# Patient Record
Sex: Female | Born: 1980 | Race: White | Hispanic: No | Marital: Married | Smoking: Never smoker
Health system: Southern US, Community
[De-identification: ages and names within clinical notes are randomized; demographics above are authoritative.]

## PROBLEM LIST (undated history)

## (undated) DIAGNOSIS — Z5189 Encounter for other specified aftercare: Secondary | ICD-10-CM

## (undated) DIAGNOSIS — IMO0001 Reserved for inherently not codable concepts without codable children: Secondary | ICD-10-CM

---

## 2001-11-24 ENCOUNTER — Other Ambulatory Visit: Admission: RE | Admit: 2001-11-24 | Discharge: 2001-11-24 | Payer: Self-pay | Admitting: *Deleted

## 2002-06-23 ENCOUNTER — Inpatient Hospital Stay (HOSPITAL_COMMUNITY): Admission: AD | Admit: 2002-06-23 | Discharge: 2002-06-27 | Payer: Self-pay | Admitting: Obstetrics and Gynecology

## 2002-08-05 ENCOUNTER — Other Ambulatory Visit: Admission: RE | Admit: 2002-08-05 | Discharge: 2002-08-05 | Payer: Self-pay | Admitting: Obstetrics and Gynecology

## 2003-09-20 ENCOUNTER — Other Ambulatory Visit: Admission: RE | Admit: 2003-09-20 | Discharge: 2003-09-20 | Payer: Self-pay | Admitting: Obstetrics and Gynecology

## 2005-03-27 ENCOUNTER — Other Ambulatory Visit: Admission: RE | Admit: 2005-03-27 | Discharge: 2005-03-27 | Payer: Self-pay | Admitting: Obstetrics and Gynecology

## 2011-10-15 ENCOUNTER — Encounter (HOSPITAL_COMMUNITY): Payer: Self-pay | Admitting: Pharmacist

## 2011-10-22 ENCOUNTER — Encounter (HOSPITAL_COMMUNITY)
Admission: RE | Admit: 2011-10-22 | Discharge: 2011-10-22 | Disposition: A | Discharge status: Home / Self Care | Payer: BC Managed Care – PPO | Source: Ambulatory Visit | Attending: Obstetrics and Gynecology | Admitting: Obstetrics and Gynecology

## 2011-10-22 ENCOUNTER — Encounter (HOSPITAL_COMMUNITY): Payer: Self-pay

## 2011-10-22 HISTORY — DX: Encounter for other specified aftercare: Z51.89

## 2011-10-22 HISTORY — DX: Reserved for inherently not codable concepts without codable children: IMO0001

## 2011-10-22 LAB — CBC
HCT: 40.9 % (ref 36.0–46.0)
Hemoglobin: 13.5 g/dL (ref 12.0–15.0)
MCH: 27 pg (ref 26.0–34.0)
MCHC: 33 g/dL (ref 30.0–36.0)
MCV: 81.8 fL (ref 78.0–100.0)

## 2011-10-22 LAB — SURGICAL PCR SCREEN
MRSA, PCR: NEGATIVE
Staphylococcus aureus: NEGATIVE

## 2011-10-22 NOTE — Patient Instructions (Addendum)
YOUR PROCEDURE IS SCHEDULED ON: 10/31/11  ENTER THROUGH THE MAIN ENTRANCE OF [facility] AT: 6am   USE DESK PHONE AND DIAL [phone] TO INFORM us OF YOUR ARRIVAL  CALL [phone] IF YOU HAVE ANY QUESTIONS OR PROBLEMS PRIOR TO YOUR ARRIVAL.  REMEMBER: DO NOT EAT OR DRINK AFTER MIDNIGHT : Tuesday   YOU MAY BRUSH YOUR TEETH THE MORNING OF SURGERY   TAKE THESE MEDICINES THE DAY OF SURGERY WITH SIP OF WATER:none   DO NOT WEAR JEWELRY, EYE MAKEUP, LIPSTICK OR DARK FINGERNAIL POLISH DO NOT WEAR LOTIONS  DO NOT SHAVE FOR 48 HOURS PRIOR TO SURGERY  YOU WILL NOT BE ALLOWED TO DRIVE YOURSELF HOME.  NAME OF DRIVER:Shane Hart [place]

## 2011-10-23 NOTE — H&P (Addendum)
  Margaret Villanueva presents today for preop evaluation for abnormal uterine bleeding and uterine fibroids.  She has no further childbearing desires.  I reviewed our previous ultrasound findings which shows a submucosal fibroid with a 50% intracavitary component.  She has tried progesterone therapy without any improvement and she desires definitive surgical intervention and requests hysterectomy.  Ultrasound shows an 18 x 15 mm submucosal fibroid with 50% intracavitary component.  Previous physical:  Heart:  Regular rate and rhythm.  Lungs clear to auscultation bilaterally.  Abdomen nondistended and nontender.  Pelvic exam:  Uterus is anteverted, mobile and nontender.  Past medical history:  Negative.  Past surgical history:  Cesarean section in 2004.  No medications.  She has no known drug allergies.   A&P: Abnormal bleeding, menorrhagia and submucosal fibroids.  Discussed different options with her.  She did not do well with hormonal management.  She declines hysteroscopic evaluation with resection of the endometrial fibroids.  She desires hysterectomy.  Plan laparoscopically assisted vaginal hysterectomy with preservation of the ovaries.  Discussed the risks and benefits of the procedure at length, its recovery and success and complication rates at length.  All of her questions were answered.  DL  This patient has been seen and examined.   All of her questions were answered.  Labs and vital signs reviewed.  Informed consent has been obtained.  The History and Physical is current.  10/31/11 0715 DL

## 2011-10-30 MED ORDER — DEXTROSE 5 % IV SOLN
2.0000 g | INTRAVENOUS | Status: AC
  Administered 2011-10-31: 2 g via INTRAVENOUS
  Filled 2011-10-30: qty 2

## 2011-10-31 ENCOUNTER — Encounter (HOSPITAL_COMMUNITY)
Admission: RE | Disposition: A | Discharge status: Home / Self Care | Payer: Self-pay | Source: Ambulatory Visit | Attending: Obstetrics and Gynecology

## 2011-10-31 ENCOUNTER — Encounter (HOSPITAL_COMMUNITY): Payer: Self-pay | Admitting: *Deleted

## 2011-10-31 ENCOUNTER — Ambulatory Visit (HOSPITAL_COMMUNITY)
Admission: RE | Admit: 2011-10-31 | Discharge: 2011-11-01 | Disposition: A | Discharge status: Home / Self Care | Payer: BC Managed Care – PPO | Source: Ambulatory Visit | Attending: Obstetrics and Gynecology | Admitting: Obstetrics and Gynecology

## 2011-10-31 ENCOUNTER — Ambulatory Visit (HOSPITAL_COMMUNITY): Payer: BC Managed Care – PPO | Admitting: Anesthesiology

## 2011-10-31 ENCOUNTER — Encounter (HOSPITAL_COMMUNITY): Payer: Self-pay | Admitting: Anesthesiology

## 2011-10-31 DIAGNOSIS — Z01812 Encounter for preprocedural laboratory examination: Secondary | ICD-10-CM | POA: Insufficient documentation

## 2011-10-31 DIAGNOSIS — N920 Excessive and frequent menstruation with regular cycle: Secondary | ICD-10-CM | POA: Insufficient documentation

## 2011-10-31 DIAGNOSIS — Z01818 Encounter for other preprocedural examination: Secondary | ICD-10-CM | POA: Insufficient documentation

## 2011-10-31 DIAGNOSIS — N949 Unspecified condition associated with female genital organs and menstrual cycle: Secondary | ICD-10-CM | POA: Insufficient documentation

## 2011-10-31 DIAGNOSIS — D250 Submucous leiomyoma of uterus: Secondary | ICD-10-CM | POA: Insufficient documentation

## 2011-10-31 DIAGNOSIS — Z90710 Acquired absence of both cervix and uterus: Secondary | ICD-10-CM

## 2011-10-31 DIAGNOSIS — N938 Other specified abnormal uterine and vaginal bleeding: Secondary | ICD-10-CM | POA: Insufficient documentation

## 2011-10-31 HISTORY — PX: LAPAROSCOPIC ASSISTED VAGINAL HYSTERECTOMY: SHX5398

## 2011-10-31 SURGERY — HYSTERECTOMY, VAGINAL, LAPAROSCOPY-ASSISTED
Anesthesia: General | Site: Abdomen | Wound class: Clean Contaminated

## 2011-10-31 MED ORDER — FENTANYL CITRATE 0.05 MG/ML IJ SOLN
INTRAMUSCULAR | Status: AC
  Filled 2011-10-31: qty 5

## 2011-10-31 MED ORDER — HYDROMORPHONE HCL PF 1 MG/ML IJ SOLN
0.2500 mg | INTRAMUSCULAR | Status: DC | PRN
  Administered 2011-10-31 (×2): 0.5 mg via INTRAVENOUS

## 2011-10-31 MED ORDER — PROPOFOL 10 MG/ML IV EMUL
INTRAVENOUS | Status: AC
  Filled 2011-10-31: qty 20

## 2011-10-31 MED ORDER — ZOLPIDEM TARTRATE 5 MG PO TABS: 5.0000 mg | ORAL_TABLET | Freq: Every evening | ORAL | Status: DC | PRN

## 2011-10-31 MED ORDER — ONDANSETRON HCL 4 MG/2ML IJ SOLN
4.0000 mg | Freq: Four times a day (QID) | INTRAMUSCULAR | Status: DC | PRN
  Administered 2011-10-31 – 2011-11-01 (×3): 4 mg via INTRAVENOUS
  Filled 2011-10-31 (×3): qty 2

## 2011-10-31 MED ORDER — ROCURONIUM BROMIDE 100 MG/10ML IV SOLN
INTRAVENOUS | Status: DC | PRN
  Administered 2011-10-31: 40 mg via INTRAVENOUS

## 2011-10-31 MED ORDER — GLYCOPYRROLATE 0.2 MG/ML IJ SOLN
INTRAMUSCULAR | Status: AC
  Filled 2011-10-31: qty 2

## 2011-10-31 MED ORDER — DEXTROSE-NACL 5-0.45 % IV SOLN
INTRAVENOUS | Status: DC
  Administered 2011-10-31 – 2011-11-01 (×2): via INTRAVENOUS

## 2011-10-31 MED ORDER — LIDOCAINE HCL (CARDIAC) 20 MG/ML IV SOLN
INTRAVENOUS | Status: AC
  Filled 2011-10-31: qty 5

## 2011-10-31 MED ORDER — DIPHENHYDRAMINE HCL 50 MG/ML IJ SOLN: 12.5000 mg | Freq: Four times a day (QID) | INTRAMUSCULAR | Status: DC | PRN

## 2011-10-31 MED ORDER — HYDROMORPHONE 0.3 MG/ML IV SOLN
INTRAVENOUS | Status: DC
Start: 2011-10-31 — End: 2011-11-01
  Administered 2011-10-31: 23:00:00 via INTRAVENOUS
  Administered 2011-10-31: 4.2 mg via INTRAVENOUS
  Administered 2011-10-31: 10:00:00 via INTRAVENOUS
  Administered 2011-11-01: 2.4 mg via INTRAVENOUS
  Administered 2011-11-01: 1.8 mg via INTRAVENOUS
  Filled 2011-10-31 (×2): qty 25

## 2011-10-31 MED ORDER — MIDAZOLAM HCL 5 MG/5ML IJ SOLN
INTRAMUSCULAR | Status: DC | PRN
  Administered 2011-10-31 (×2): 1 mg via INTRAVENOUS

## 2011-10-31 MED ORDER — PROPOFOL 10 MG/ML IV EMUL
INTRAVENOUS | Status: DC | PRN
  Administered 2011-10-31: 200 mg via INTRAVENOUS

## 2011-10-31 MED ORDER — NALOXONE HCL 0.4 MG/ML IJ SOLN: 0.4000 mg | INTRAMUSCULAR | Status: DC | PRN

## 2011-10-31 MED ORDER — SODIUM CHLORIDE 0.9 % IJ SOLN: 9.0000 mL | INTRAMUSCULAR | Status: DC | PRN

## 2011-10-31 MED ORDER — FENTANYL CITRATE 0.05 MG/ML IJ SOLN
INTRAMUSCULAR | Status: DC | PRN
  Administered 2011-10-31 (×4): 100 ug via INTRAVENOUS
  Administered 2011-10-31: 50 ug via INTRAVENOUS

## 2011-10-31 MED ORDER — NEOSTIGMINE METHYLSULFATE 1 MG/ML IJ SOLN
INTRAMUSCULAR | Status: AC
  Filled 2011-10-31: qty 10

## 2011-10-31 MED ORDER — IBUPROFEN 600 MG PO TABS
600.0000 mg | ORAL_TABLET | Freq: Four times a day (QID) | ORAL | Status: DC | PRN
  Administered 2011-11-01: 600 mg via ORAL
  Filled 2011-10-31: qty 1

## 2011-10-31 MED ORDER — HYDROMORPHONE HCL PF 1 MG/ML IJ SOLN
INTRAMUSCULAR | Status: AC
  Administered 2011-10-31: 0.5 mg via INTRAVENOUS
  Filled 2011-10-31: qty 1

## 2011-10-31 MED ORDER — BUPIVACAINE HCL (PF) 0.25 % IJ SOLN
INTRAMUSCULAR | Status: AC
  Filled 2011-10-31: qty 30

## 2011-10-31 MED ORDER — LACTATED RINGERS IR SOLN
Status: DC | PRN
  Administered 2011-10-31: 3000 mL

## 2011-10-31 MED ORDER — KETOROLAC TROMETHAMINE 30 MG/ML IJ SOLN
INTRAMUSCULAR | Status: AC
  Filled 2011-10-31: qty 1

## 2011-10-31 MED ORDER — LACTATED RINGERS IV SOLN
INTRAVENOUS | Status: DC
  Administered 2011-10-31: 08:00:00 via INTRAVENOUS
  Administered 2011-10-31: 1000 mL via INTRAVENOUS
  Administered 2011-10-31: 07:00:00 via INTRAVENOUS

## 2011-10-31 MED ORDER — MENTHOL 3 MG MT LOZG: 1.0000 | LOZENGE | OROMUCOSAL | Status: DC | PRN

## 2011-10-31 MED ORDER — DEXAMETHASONE SODIUM PHOSPHATE 10 MG/ML IJ SOLN
INTRAMUSCULAR | Status: AC
  Filled 2011-10-31: qty 1

## 2011-10-31 MED ORDER — BUPIVACAINE HCL (PF) 0.25 % IJ SOLN
INTRAMUSCULAR | Status: DC | PRN
  Administered 2011-10-31: 10 mL

## 2011-10-31 MED ORDER — HYDROMORPHONE HCL PF 1 MG/ML IJ SOLN
0.2000 mg | INTRAMUSCULAR | Status: DC | PRN
  Filled 2011-10-31: qty 1

## 2011-10-31 MED ORDER — DEXAMETHASONE SODIUM PHOSPHATE 4 MG/ML IJ SOLN
INTRAMUSCULAR | Status: DC | PRN
  Administered 2011-10-31: 6 mg via INTRAVENOUS

## 2011-10-31 MED ORDER — DIPHENHYDRAMINE HCL 12.5 MG/5ML PO ELIX: 12.5000 mg | ORAL_SOLUTION | Freq: Four times a day (QID) | ORAL | Status: DC | PRN

## 2011-10-31 MED ORDER — MIDAZOLAM HCL 2 MG/2ML IJ SOLN
INTRAMUSCULAR | Status: AC
  Filled 2011-10-31: qty 2

## 2011-10-31 MED ORDER — ONDANSETRON HCL 4 MG/2ML IJ SOLN
INTRAMUSCULAR | Status: AC
  Filled 2011-10-31: qty 2

## 2011-10-31 MED ORDER — LIDOCAINE HCL (CARDIAC) 20 MG/ML IV SOLN
INTRAVENOUS | Status: DC | PRN
  Administered 2011-10-31: 60 mg via INTRAVENOUS

## 2011-10-31 MED ORDER — ROCURONIUM BROMIDE 50 MG/5ML IV SOLN
INTRAVENOUS | Status: AC
  Filled 2011-10-31: qty 1

## 2011-10-31 MED ORDER — OXYCODONE-ACETAMINOPHEN 5-325 MG PO TABS
1.0000 | ORAL_TABLET | ORAL | Status: DC | PRN
Start: 2011-10-31 — End: 2011-11-01
  Administered 2011-11-01: 1 via ORAL
  Filled 2011-10-31: qty 1

## 2011-10-31 SURGICAL SUPPLY — 42 items
ADH SKN CLS APL DERMABOND .7 (GAUZE/BANDAGES/DRESSINGS) ×1
BLADE SURG 15 STRL LF C SS BP (BLADE) ×1 IMPLANT
BLADE SURG 15 STRL SS (BLADE) ×2
CABLE HIGH FREQUENCY MONO STRZ (ELECTRODE) IMPLANT
CATH ROBINSON RED A/P 16FR (CATHETERS) ×2 IMPLANT
CLOTH BEACON ORANGE TIMEOUT ST (SAFETY) ×2 IMPLANT
CONT PATH 16OZ SNAP LID 3702 (MISCELLANEOUS) ×2 IMPLANT
COVER TABLE BACK 60X90 (DRAPES) ×2 IMPLANT
DECANTER SPIKE VIAL GLASS SM (MISCELLANEOUS) IMPLANT
DERMABOND ADVANCED (GAUZE/BANDAGES/DRESSINGS) ×1
DERMABOND ADVANCED .7 DNX12 (GAUZE/BANDAGES/DRESSINGS) IMPLANT
ELECT LIGASURE LONG (ELECTRODE) ×2 IMPLANT
ELECT REM PT RETURN 9FT ADLT (ELECTROSURGICAL) ×2
ELECTRODE REM PT RTRN 9FT ADLT (ELECTROSURGICAL) ×1 IMPLANT
FORCEPS CUTTING 45CM 5MM (CUTTING FORCEPS) ×2 IMPLANT
GLOVE BIO SURGEON STRL SZ8 (GLOVE) ×2 IMPLANT
GLOVE BIOGEL PI IND STRL 6.5 (GLOVE) ×1 IMPLANT
GLOVE BIOGEL PI INDICATOR 6.5 (GLOVE) ×1
GLOVE SURG ORTHO 8.0 STRL STRW (GLOVE) ×6 IMPLANT
GOWN PREVENTION PLUS LG XLONG (DISPOSABLE) ×6 IMPLANT
NDL INSUFFLATION 14GA 120MM (NEEDLE) ×1 IMPLANT
NEEDLE INSUFFLATION 14GA 120MM (NEEDLE) ×2 IMPLANT
NS IRRIG 1000ML POUR BTL (IV SOLUTION) ×2 IMPLANT
PACK LAVH (CUSTOM PROCEDURE TRAY) ×2 IMPLANT
PROTECTOR NERVE ULNAR (MISCELLANEOUS) ×2 IMPLANT
SET IRRIG TUBING LAPAROSCOPIC (IRRIGATION / IRRIGATOR) ×2 IMPLANT
SOLUTION ELECTROLUBE (MISCELLANEOUS) IMPLANT
STRIP CLOSURE SKIN 1/4X3 (GAUZE/BANDAGES/DRESSINGS) IMPLANT
SUT MNCRL 0 MO-4 VIOLET 18 CR (SUTURE) ×2 IMPLANT
SUT MNCRL 0 VIOLET 6X18 (SUTURE) ×1 IMPLANT
SUT MNCRL AB 0 CT1 27 (SUTURE) IMPLANT
SUT MON AB 2-0 CT1 36 (SUTURE) IMPLANT
SUT MONOCRYL 0 6X18 (SUTURE) ×1
SUT MONOCRYL 0 MO 4 18  CR/8 (SUTURE) ×2
SUT VICRYL 0 UR6 27IN ABS (SUTURE) ×2 IMPLANT
SUT VICRYL RAPIDE 3 0 (SUTURE) ×2 IMPLANT
TOWEL OR 17X24 6PK STRL BLUE (TOWEL DISPOSABLE) ×4 IMPLANT
TRAY FOLEY CATH 14FR (SET/KITS/TRAYS/PACK) ×2 IMPLANT
TROCAR Z-THREAD BLADED 11X100M (TROCAR) ×2 IMPLANT
TROCAR Z-THREAD BLADED 5X100MM (TROCAR) ×2 IMPLANT
WARMER LAPAROSCOPE (MISCELLANEOUS) ×2 IMPLANT
WATER STERILE IRR 1000ML POUR (IV SOLUTION) IMPLANT

## 2011-10-31 NOTE — Op Note (Signed)
Margaret Villanueva, Margaret Villanueva                  ACCOUNT NO.:  [id number]  MEDICAL RECORD NO.:  [id number]  LOCATION:  9316                          FACILITY:  [facility]  PHYSICIAN:  Dineen Kid. Rana Snare, M.D.    DATE OF BIRTH:  [birth year]  DATE OF PROCEDURE:  10/31/2011 DATE OF DISCHARGE:                              OPERATIVE REPORT   PREOPERATIVE DIAGNOSES:  Abnormal uterine bleeding, menorrhagia, and submucosal fibroids.  POSTOPERATIVE DIAGNOSES:  Abnormal uterine bleeding, menorrhagia, and submucosal fibroids.  PROCEDURE:  Laparoscopic-assisted vaginal hysterectomy.  SURGEON:  Dineen Kid. Rana Snare, MD  ASSISTANTS:  Freddy Finner, MD  ANESTHESIA:  General endotracheal.  INDICATIONS:  Margaret Villanueva is a 31 year old with abnormal bleeding, uterine fibroids, but no further childbearing desires.  Ultrasound showing submucosal fibroids.  She desires definitive surgical intervention and requests hysterectomy.  All risks and benefits of hysterectomy were discussed at length, and informed consent was obtained.  FINDINGS AT THE TIME OF SURGERY:  Normal-appearing liver and appendix. Uterus is slightly enlarged.  Normal-appearing ovaries and cul-de-sac.  DESCRIPTION OF PROCEDURE:  After adequate analgesia, the patient was placed in the dorsal lithotomy position.  She was sterilely prepped and draped.  Bladder was sterilely drained.  Graves speculum was placed. Tenaculum was placed into the anterior lip of the cervix.  A 1 cm infraumbilical skin incision was made.  A Veress needle was inserted. The abdomen was insufflated with dullness to percussion.  An 11-mm trocar was inserted with the laparoscope.  The above findings were noted.  A 5-mm trocar was inserted to the left of the midline two fingerbreadths above the pubic symphysis under direct visualization. After careful and systematic evaluation of the abdomen and pelvis, a Gyrus cutting forceps used to ligate across the left utero-ovarian ligament, left  fallopian tube and round ligament to the inferior portion of the broad ligament achieving good hemostasis and dissected along the way.  This was done similarly along the right utero-ovarian ligament, right fallopian tube and round ligament down to the inferior portion of the broad ligament with good hemostasis achieved and care taken to avoid the underlying ureter.  The uterovesical junction was identified and the bladder was elevated and a small window was made at the uterovesical junction creating a small bladder flap.  The abdomen was then desufflated.  Legs were repositioned.  Weighted speculum was placed in the vagina.  Posterior colpotomy was then performed.  The cervix was then circumscribed with Bovie cautery.  LigaSure instrument was used to ligate across the uterosacral ligaments bilaterally and cardinal ligaments bilaterally with dissection with Mayo scissors.  The anterior vaginal mucosa was then dissected off the cervix.  The anterior peritoneum was entered sharply and Deaver retractor was placed underneath the bladder.  LigaSure was used then to clamp and coagulate the uterine vasculature bilaterally, dissected with Mayo scissors to the inferior portion of the broad ligaments bilaterally and the uterus was then removed.  Good hemostasis was achieved.  The uterosacral ligaments were identified and ligated with a figure-of-eight suture of 0 Monocryl suture.  The posterior peritoneum was then closed in a pursestring fashion with 0 Monocryl suture.  The vagina was then plicated  in midline with 0 Monocryl suture in a figure-of-eight and closed in a vertical fashion using figure-of-eights of 0 Monocryl suture with good approximation and hemostasis was noted, and good support noted.  Foley catheter was placed with return of clear yellow urine.  Legs were repositioned.  Abdomen was reinsufflated.  Examination of the pelvis and cul-de-sac revealed good hemostasis.  A Nezhat suction  irrigator was used to irrigate the abdomen and pelvis.  Reexamination of all the pedicles revealed good hemostasis, good peristalsis noted on the ureters bilaterally.  The trocars were then removed.  Infraumbilical skin incision was closed with 0 Vicryl interrupted suture in the fascia, 3-0 Vicryl Rapide subcuticular suture.  The 5-mm site was closed with 3-0 Vicryl Rapide subcuticular suture.  The incisions were injected with 0.25% Marcaine, total of 10 mL used.  Dermabond was also placed on the 5- mm site.  The patient was then transferred to recovery room in stable condition.  Sponge and instrument count was normal x3.  Estimated blood loss was 250 mL.  The patient received 2 g of cefotetan preoperatively.     Dineen Kid Rana Snare, M.D.     DCL/MEDQ  D:  10/31/2011  T:  10/31/2011  Job:  [id number]

## 2011-10-31 NOTE — Transfer of Care (Signed)
Immediate Anesthesia Transfer of Care Note  Patient: Margaret Villanueva  Procedure(s) Performed: Procedure(s) (LRB): LAPAROSCOPIC ASSISTED VAGINAL HYSTERECTOMY (N/A)  Patient Location: PACU  Anesthesia Type: General  Level of Consciousness: awake, alert  and oriented  Airway & Oxygen Therapy: Patient Spontanous Breathing and Patient connected to nasal cannula oxygen  Post-op Assessment: Report given to PACU RN and Post -op Vital signs reviewed and stable  Post vital signs: Reviewed and stable  Complications: No apparent anesthesia complications

## 2011-10-31 NOTE — Brief Op Note (Signed)
10/31/2011  8:48 AM  PATIENT:  Margaret Villanueva  31 y.o. female  PRE-OPERATIVE DIAGNOSIS:  AUB, FIBROIDS  POST-OPERATIVE DIAGNOSIS:  Abnormal uterine bleeding, Fibroids  PROCEDURE:  Procedure(s) (LRB): LAPAROSCOPIC ASSISTED VAGINAL HYSTERECTOMY (N/A)  SURGEON:  Surgeon(s) and Role:    * Turner Daniels, MD - Primary    * W Varney Baas, MD - Assisting  PHYSICIAN ASSISTANT:   ASSISTANTS: neal   ANESTHESIA:   general  EBL:  Total I/O In: 1000 [I.V.:1000] Out: 325 [Urine:75; Blood:250]  BLOOD ADMINISTERED:none  DRAINS: Urinary Catheter (Foley)   LOCAL MEDICATIONS USED:  MARCAINE     SPECIMEN:  Source of Specimen:  uterus  DISPOSITION OF SPECIMEN:  PATHOLOGY  COUNTS:  YES  TOURNIQUET:  * No tourniquets in log *  DICTATION: .Other Dictation: Dictation Number [id number]  PLAN OF CARE: Admit for overnight observation  PATIENT DISPOSITION:  PACU - hemodynamically stable.   Delay start of Pharmacological VTE agent (>24hrs) due to surgical blood loss or risk of bleeding: no

## 2011-10-31 NOTE — Anesthesia Procedure Notes (Addendum)
Date/Time: 10/31/2011 8:02 AM Performed by: Kendal Hymen   Procedure Name: Intubation Performed by: Kendal Hymen Pre-anesthesia Checklist: Emergency Drugs available, Suction available, Timeout performed, Patient identified and Patient being monitored Patient Re-evaluated:Patient Re-evaluated prior to inductionOxygen Delivery Method: Circle system utilized Preoxygenation: Pre-oxygenation with 100% oxygen Intubation Type: IV induction Ventilation: Mask ventilation without difficulty Laryngoscope Size: Miller and 3 Grade View: Grade I Tube type: Oral Tube size: 7.0 mm Number of attempts: 1 Airway Equipment and Method: Stylet Placement Confirmation: ETT inserted through vocal cords under direct vision,  positive ETCO2 and breath sounds checked- equal and bilateral Secured at: 21 cm Tube secured with: Tape Dental Injury: Teeth and Oropharynx as per pre-operative assessment

## 2011-10-31 NOTE — Anesthesia Preprocedure Evaluation (Addendum)
Anesthesia Evaluation  Patient identified by MRN, date of birth, ID band Patient awake    Reviewed: Allergy & Precautions, H&P , NPO status , Patient's Chart, lab work & pertinent test results, reviewed documented beta blocker date and time   History of Anesthesia Complications Negative for: history of anesthetic complications  Airway       Dental  (+) Teeth Intact   Pulmonary neg pulmonary ROS,  breath sounds clear to auscultation  Pulmonary exam normal       Cardiovascular Exercise Tolerance: Good negative cardio ROS  Rhythm:regular Rate:Normal     Neuro/Psych negative neurological ROS  negative psych ROS   GI/Hepatic negative GI ROS, Neg liver ROS,   Endo/Other  Morbid obesity  Renal/GU negative Renal ROS  Female GU complaint     Musculoskeletal   Abdominal   Peds  Hematology negative hematology ROS (+)   Anesthesia Other Findings   Reproductive/Obstetrics negative OB ROS                          Anesthesia Physical Anesthesia Plan  ASA: III  Anesthesia Plan: General ETT   Post-op Pain Management:    Induction:   Airway Management Planned:   Additional Equipment:   Intra-op Plan:   Post-operative Plan:   Informed Consent: I have reviewed the patients History and Physical, chart, labs and discussed the procedure including the risks, benefits and alternatives for the proposed anesthesia with the patient or authorized representative who has indicated his/her understanding and acceptance.   Dental Advisory Given  Plan Discussed with: CRNA, Surgeon and Anesthesiologist  Anesthesia Plan Comments:        Anesthesia Quick Evaluation

## 2011-10-31 NOTE — Anesthesia Postprocedure Evaluation (Signed)
  Anesthesia Post-op Note  Patient: Margaret Villanueva  Procedure(s) Performed: Procedure(s) (LRB): LAPAROSCOPIC ASSISTED VAGINAL HYSTERECTOMY (N/A)  Patient Location: PACU  Anesthesia Type: General  Level of Consciousness: awake, alert  and oriented  Airway and Oxygen Therapy: Patient Spontanous Breathing  Post-op Pain: none  Post-op Assessment: Post-op Vital signs reviewed, Patient's Cardiovascular Status Stable, Respiratory Function Stable, Patent Airway, No signs of Nausea or vomiting and Pain level controlled  Post-op Vital Signs: Reviewed and stable  Complications: No apparent anesthesia complications

## 2011-10-31 NOTE — Addendum Note (Signed)
Addendum  created 10/31/11 1800 by Ashkan Chamberland M Tully Burgo, RN   Modules edited:Notes Section    

## 2011-10-31 NOTE — Anesthesia Postprocedure Evaluation (Signed)
  Anesthesia Post-op Note  Patient: Margaret Villanueva  Procedure(s) Performed: Procedure(s) (LRB): LAPAROSCOPIC ASSISTED VAGINAL HYSTERECTOMY (N/A)  Patient Location: PACU and Women's Unit  Anesthesia Type: General  Level of Consciousness: awake, alert  and oriented  Airway and Oxygen Therapy: Patient Spontanous Breathing and Patient connected to nasal cannula oxygen  Post-op Pain: mild  Post-op Assessment: Post-op Vital signs reviewed  Post-op Vital Signs: Reviewed and stable  Complications: No apparent anesthesia complications

## 2011-10-31 NOTE — Progress Notes (Signed)
Patient ID: Margaret Villanueva, female   DOB: [birth year], 31 y.o.   MRN: [id number] Pt without complaints Some nausea VSSAF UOP good/clear Abd soft  POst op Doing well  Continue routine care DL

## 2011-11-01 ENCOUNTER — Encounter (HOSPITAL_COMMUNITY): Payer: Self-pay | Admitting: Obstetrics and Gynecology

## 2011-11-01 LAB — CBC
Hemoglobin: 11.3 g/dL — ABNORMAL LOW (ref 12.0–15.0)
MCH: 26.9 pg (ref 26.0–34.0)
MCHC: 32.5 g/dL (ref 30.0–36.0)
MCV: 82.9 fL (ref 78.0–100.0)
Platelets: 312 10*3/uL (ref 150–400)
RBC: 4.2 MIL/uL (ref 3.87–5.11)

## 2011-11-01 MED ORDER — OXYCODONE-ACETAMINOPHEN 5-325 MG PO TABS: 1.0000 | ORAL_TABLET | ORAL | Status: AC | PRN

## 2011-11-01 NOTE — Discharge Summary (Signed)
Physician Discharge Summary  Patient ID: Margaret Villanueva MRN: [id number] DOB/AGE: 07/11/1980 31 y.o.  Admit date: 10/31/2011 Discharge date: 11/01/2011  Admission Diagnoses:  Discharge Diagnoses:  Active Problems:  * No active hospital problems. *    Discharged Condition: good  Hospital Course: uncomplicated LAVH with postop care complicated by slow return of bowel function (mild ileus). By post op d 31 patient was tolerating po well, stable labs, normal exam and wanted dc home.  Consults: None  Significant Diagnostic Studies: labs:   Treatments: IV hydration, antibiotics: cefotetan, analgesia: acetaminophen w/ codeine and Dilaudid and surgery: LAVH  Discharge Exam: Blood pressure 127/80, pulse 80, temperature 98 F (36.7 C), temperature source Oral, resp. rate 13, height 5\' 5"  (1.651 m), weight 113.399 kg (250 lb), SpO2 98.00%. General appearance: alert, cooperative, appears stated age and no distress GI: soft, non-tender; bowel sounds normal; no masses,  no organomegaly Incision/Wound: Clean and dry  Disposition:   Discharge Orders    Future Orders Please Complete By Expires   Diet general      Increase activity slowly      Call MD for:  temperature >100.4      Call MD for:  persistant nausea and vomiting      Call MD for:  severe uncontrolled pain      Call MD for:  redness, tenderness, or signs of infection (pain, swelling, redness, odor or green/yellow discharge around incision site)      Call MD for:  difficulty breathing, headache or visual disturbances      Discharge instructions      Comments:   Light activity for 1 week followup in office in 2 weeks     Medication List  As of 11/01/2011  7:34 AM   STOP taking these medications         phentermine 37.5 MG capsule         TAKE these medications         oxyCODONE-acetaminophen 5-325 MG per tablet   Commonly known as: PERCOCET   Take 1-2 tablets by mouth every 4 (four) hours as needed (moderate to severe  pain (when tolerating fluids)).             Signed: Mazey Mantell C 11/01/2011, 7:34 AM

## 2011-11-01 NOTE — Progress Notes (Signed)
Subjective: Patient reports nausea.    Objective: I have reviewed patient's vital signs, intake and output, medications and labs.  General: alert, cooperative, appears stated age and no distress GI: soft, non-tender; bowel sounds normal; no masses,  no organomegaly   Assessment/Plan: POD 1 Adv diet If progresses well can dc home  LOS: 1 day    Derek Laughter C 11/01/2011, 7:31 AM

## 2013-02-06 ENCOUNTER — Encounter: Payer: Self-pay | Admitting: *Deleted

## 2013-10-29 ENCOUNTER — Telehealth: Payer: Self-pay | Admitting: Genetic Counselor

## 2013-10-29 NOTE — Telephone Encounter (Signed)
LEFT MESSAGE FOR PATIENT TO RETURN CALL TO SCHEDULE GENETIC APPT.  °

## 2013-11-02 ENCOUNTER — Telehealth: Payer: Self-pay | Admitting: Genetic Counselor

## 2013-11-02 NOTE — Telephone Encounter (Signed)
CALLED PATIENT TO SCHEDULE GENETIC APP PER CANCEL APPT.

## 2014-10-28 ENCOUNTER — Other Ambulatory Visit: Payer: Self-pay | Admitting: Obstetrics and Gynecology

## 2014-10-29 LAB — CYTOLOGY - PAP

## 2015-08-10 ENCOUNTER — Other Ambulatory Visit (HOSPITAL_COMMUNITY): Payer: Self-pay | Admitting: Internal Medicine

## 2015-08-10 DIAGNOSIS — C73 Malignant neoplasm of thyroid gland: Secondary | ICD-10-CM

## 2015-08-15 ENCOUNTER — Encounter (HOSPITAL_COMMUNITY): Payer: Self-pay

## 2015-08-16 ENCOUNTER — Encounter (HOSPITAL_COMMUNITY): Payer: Self-pay

## 2015-08-17 ENCOUNTER — Encounter (HOSPITAL_COMMUNITY): Payer: Self-pay

## 2015-08-26 ENCOUNTER — Encounter (HOSPITAL_COMMUNITY): Payer: Self-pay

## 2016-11-26 ENCOUNTER — Other Ambulatory Visit: Payer: Self-pay | Admitting: Obstetrics and Gynecology

## 2016-11-26 DIAGNOSIS — R928 Other abnormal and inconclusive findings on diagnostic imaging of breast: Secondary | ICD-10-CM

## 2016-11-29 ENCOUNTER — Ambulatory Visit
Admission: RE | Admit: 2016-11-29 | Discharge: 2016-11-29 | Disposition: A | Discharge status: Home / Self Care | Payer: 59 | Source: Ambulatory Visit | Attending: Obstetrics and Gynecology | Admitting: Obstetrics and Gynecology

## 2016-11-29 ENCOUNTER — Other Ambulatory Visit: Payer: Self-pay | Admitting: Obstetrics and Gynecology

## 2016-11-29 ENCOUNTER — Other Ambulatory Visit: Payer: Self-pay

## 2016-11-29 DIAGNOSIS — R928 Other abnormal and inconclusive findings on diagnostic imaging of breast: Secondary | ICD-10-CM

## 2016-11-29 DIAGNOSIS — Z09 Encounter for follow-up examination after completed treatment for conditions other than malignant neoplasm: Secondary | ICD-10-CM

## 2016-12-11 ENCOUNTER — Other Ambulatory Visit: Payer: Self-pay | Admitting: Obstetrics and Gynecology

## 2016-12-11 DIAGNOSIS — N6489 Other specified disorders of breast: Secondary | ICD-10-CM

## 2017-06-06 ENCOUNTER — Ambulatory Visit: Payer: 59

## 2017-06-06 ENCOUNTER — Ambulatory Visit
Admission: RE | Admit: 2017-06-06 | Discharge: 2017-06-06 | Disposition: A | Discharge status: Home / Self Care | Payer: 59 | Source: Ambulatory Visit | Attending: Obstetrics and Gynecology | Admitting: Obstetrics and Gynecology

## 2017-06-06 ENCOUNTER — Other Ambulatory Visit: Payer: 59

## 2017-06-06 DIAGNOSIS — N6489 Other specified disorders of breast: Secondary | ICD-10-CM

## 2021-03-29 NOTE — Progress Notes (Signed)
Patient referred by Margaret Shorten, MD for family h/o AAA  Subjective:   Margaret Villanueva, female    DOB: Aug 16, 1980, 40 y.o.   MRN: [id number]   Chief Complaint  Patient presents with   family history of AAA   New Patient (Initial Visit)     HPI  40 y.o. Caucasian female with hypothyroidism, family h/o AAA  Patient is strong family history of AAA on both maternal and paternal sides.  Her father had AAA requiring stent graft, followed by rupture on, he also had an MI.  He had 2 paternal cousins who had ruptured AAA in their teens and 55s.  Her mother also had AAA, but he did not require any intervention.  She also had history of SVT, PVC, carotid atherosclerosis.  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  She denies any chest pain, shortness of breath symptoms.  She has occasional leg edema.  She has noticed that her heart rate runs around 100 at times at rest, without any associated symptoms.  She has noticed this recently on her Fitbit and apple watch.  She endorses drinking 1-2 sodas every day.  She does not drink alcohol or smoke.   Past Medical History:  Diagnosis Date   Blood transfusion 1981   as newborn- [place]      Past Surgical History:  Procedure Laterality Date   CESAREAN SECTION     LAPAROSCOPIC ASSISTED VAGINAL HYSTERECTOMY  10/31/2011   Procedure: LAPAROSCOPIC ASSISTED VAGINAL HYSTERECTOMY;  Surgeon: Luz Lex, MD;  Location: [facility];  Service: Gynecology;  Laterality: N/A;     Social History   Tobacco Use  Smoking Status Never  Smokeless Tobacco Not on file    Social History   Substance and Sexual Activity  Alcohol Use No     Family History  Problem Relation Age of Onset   Breast cancer Mother 71   Breast cancer Maternal Aunt      No current outpatient medications on file prior to visit.   No current facility-administered medications on file prior to visit.    Cardiovascular and other pertinent  studies:  EKG 03/30/2021: Sinus rhythm 81 bpm Normal EKG  Recent labs: None available    Review of Systems  Cardiovascular:  Negative for chest pain, dyspnea on exertion, leg swelling, palpitations and syncope.        Vitals:   03/30/21 1411  BP: 135/77  Pulse: 79  Resp: 16  Temp: 98 F (36.7 C)  SpO2: 98%     Body mass index is 38.62 kg/m. Filed Weights   03/30/21 1411  Weight: 225 lb (102.1 kg)     Objective:   Physical Exam Vitals and nursing note reviewed.  Constitutional:      General: She is not in acute distress. Neck:     Vascular: No JVD.  Cardiovascular:     Rate and Rhythm: Normal rate and regular rhythm.     Heart sounds: Normal heart sounds. No murmur heard. Pulmonary:     Effort: Pulmonary effort is normal.     Breath sounds: Normal breath sounds. No wheezing or rales.  Musculoskeletal:     Right lower leg: No edema.     Left lower leg: No edema.        Assessment & Recommendations:    40 y.o. Caucasian female with hypothyroidism, family h/o AAA, family h/o CAD  Recommend aorta duplex given strong family h/o AAA Also recommend  CT cardiac scoring, given family h/o CAD If irregular heart beat or Afib noted on Apple watch, or if palpitations increase, could consider Cardiologs Apple watch tracing monitoring  Further recommendations after above testing   Thank you for referring the patient to [place]. Please feel free to contact with any questions.   Nigel Mormon, MD Pager: [phone] Office: [phone]

## 2021-03-30 ENCOUNTER — Ambulatory Visit: Payer: 59 | Admitting: Cardiology

## 2021-03-30 ENCOUNTER — Other Ambulatory Visit: Payer: Self-pay

## 2021-03-30 ENCOUNTER — Encounter: Payer: Self-pay | Admitting: Cardiology

## 2021-03-30 VITALS — BP 135/77 | HR 79 | Temp 98.0°F | Resp 16 | Ht 64.0 in | Wt 225.0 lb

## 2021-03-30 DIAGNOSIS — Z8249 Family history of ischemic heart disease and other diseases of the circulatory system: Secondary | ICD-10-CM

## 2021-04-04 NOTE — Telephone Encounter (Signed)
From pt

## 2021-04-05 ENCOUNTER — Ambulatory Visit: Payer: 59

## 2021-04-05 ENCOUNTER — Other Ambulatory Visit: Payer: Self-pay

## 2021-04-05 DIAGNOSIS — Z8249 Family history of ischemic heart disease and other diseases of the circulatory system: Secondary | ICD-10-CM

## 2021-04-10 ENCOUNTER — Other Ambulatory Visit: Payer: Self-pay | Admitting: Obstetrics and Gynecology

## 2021-04-10 DIAGNOSIS — R928 Other abnormal and inconclusive findings on diagnostic imaging of breast: Secondary | ICD-10-CM

## 2021-04-10 NOTE — Telephone Encounter (Signed)
Yes. This is good news.  Regards, Dr. Virgina Jock

## 2021-04-10 NOTE — Telephone Encounter (Signed)
From pt

## 2021-04-11 NOTE — Telephone Encounter (Signed)
From pt

## 2021-04-19 ENCOUNTER — Ambulatory Visit
Admission: RE | Admit: 2021-04-19 | Discharge: 2021-04-19 | Disposition: A | Discharge status: Home / Self Care | Payer: No Typology Code available for payment source | Source: Ambulatory Visit | Attending: Cardiology | Admitting: Cardiology

## 2021-04-19 DIAGNOSIS — Z8249 Family history of ischemic heart disease and other diseases of the circulatory system: Secondary | ICD-10-CM

## 2021-05-03 ENCOUNTER — Other Ambulatory Visit: Payer: Self-pay | Admitting: Obstetrics and Gynecology

## 2021-05-03 ENCOUNTER — Ambulatory Visit
Admission: RE | Admit: 2021-05-03 | Discharge: 2021-05-03 | Disposition: A | Discharge status: Home / Self Care | Payer: No Typology Code available for payment source | Source: Ambulatory Visit | Attending: Obstetrics and Gynecology | Admitting: Obstetrics and Gynecology

## 2021-05-03 ENCOUNTER — Ambulatory Visit
Admission: RE | Admit: 2021-05-03 | Discharge: 2021-05-03 | Disposition: A | Discharge status: Home / Self Care | Payer: 59 | Source: Ambulatory Visit | Attending: Obstetrics and Gynecology | Admitting: Obstetrics and Gynecology

## 2021-05-03 DIAGNOSIS — N6310 Unspecified lump in the right breast, unspecified quadrant: Secondary | ICD-10-CM

## 2021-05-03 DIAGNOSIS — R928 Other abnormal and inconclusive findings on diagnostic imaging of breast: Secondary | ICD-10-CM

## 2021-05-09 ENCOUNTER — Ambulatory Visit
Admission: RE | Admit: 2021-05-09 | Discharge: 2021-05-09 | Disposition: A | Discharge status: Home / Self Care | Payer: 59 | Source: Ambulatory Visit | Attending: Obstetrics and Gynecology | Admitting: Obstetrics and Gynecology

## 2021-05-09 DIAGNOSIS — N6310 Unspecified lump in the right breast, unspecified quadrant: Secondary | ICD-10-CM

## 2021-05-09 HISTORY — PX: BREAST BIOPSY: SHX20

## 2021-05-17 ENCOUNTER — Emergency Department (HOSPITAL_BASED_OUTPATIENT_CLINIC_OR_DEPARTMENT_OTHER): Payer: 59

## 2021-05-17 ENCOUNTER — Other Ambulatory Visit: Payer: Self-pay

## 2021-05-17 ENCOUNTER — Encounter (HOSPITAL_BASED_OUTPATIENT_CLINIC_OR_DEPARTMENT_OTHER): Payer: Self-pay | Admitting: Emergency Medicine

## 2021-05-17 ENCOUNTER — Emergency Department (HOSPITAL_BASED_OUTPATIENT_CLINIC_OR_DEPARTMENT_OTHER)
Admission: EM | Admit: 2021-05-17 | Discharge: 2021-05-17 | Disposition: A | Discharge status: Home / Self Care | Payer: 59 | Attending: Emergency Medicine | Admitting: Emergency Medicine

## 2021-05-17 DIAGNOSIS — R109 Unspecified abdominal pain: Secondary | ICD-10-CM | POA: Diagnosis present

## 2021-05-17 DIAGNOSIS — N83519 Torsion of ovary and ovarian pedicle, unspecified side: Secondary | ICD-10-CM

## 2021-05-17 DIAGNOSIS — R112 Nausea with vomiting, unspecified: Secondary | ICD-10-CM | POA: Diagnosis not present

## 2021-05-17 DIAGNOSIS — R102 Pelvic and perineal pain: Secondary | ICD-10-CM | POA: Diagnosis not present

## 2021-05-17 DIAGNOSIS — Z20822 Contact with and (suspected) exposure to covid-19: Secondary | ICD-10-CM | POA: Diagnosis not present

## 2021-05-17 LAB — URINALYSIS, ROUTINE W REFLEX MICROSCOPIC
Bilirubin Urine: NEGATIVE
Glucose, UA: NEGATIVE mg/dL
Hgb urine dipstick: NEGATIVE
Leukocytes,Ua: NEGATIVE
Nitrite: NEGATIVE
Protein, ur: NEGATIVE mg/dL
Specific Gravity, Urine: 1.012 (ref 1.005–1.030)
pH: 6 (ref 5.0–8.0)

## 2021-05-17 LAB — CBC
HCT: 46.5 % — ABNORMAL HIGH (ref 36.0–46.0)
Hemoglobin: 15.5 g/dL — ABNORMAL HIGH (ref 12.0–15.0)
MCH: 28.9 pg (ref 26.0–34.0)
MCHC: 33.3 g/dL (ref 30.0–36.0)
MCV: 86.8 fL (ref 80.0–100.0)
Platelets: 301 10*3/uL (ref 150–400)
RBC: 5.36 MIL/uL — ABNORMAL HIGH (ref 3.87–5.11)
RDW: 12.8 % (ref 11.5–15.5)
WBC: 8.8 10*3/uL (ref 4.0–10.5)
nRBC: 0 % (ref 0.0–0.2)

## 2021-05-17 LAB — PREGNANCY, URINE: Preg Test, Ur: NEGATIVE

## 2021-05-17 LAB — COMPREHENSIVE METABOLIC PANEL
ALT: 28 U/L (ref 0–44)
AST: 18 U/L (ref 15–41)
Albumin: 4.1 g/dL (ref 3.5–5.0)
Alkaline Phosphatase: 69 U/L (ref 38–126)
Anion gap: 8 (ref 5–15)
BUN: 12 mg/dL (ref 6–20)
CO2: 27 mmol/L (ref 22–32)
Calcium: 9.8 mg/dL (ref 8.9–10.3)
Chloride: 101 mmol/L (ref 98–111)
Creatinine, Ser: 0.81 mg/dL (ref 0.44–1.00)
GFR, Estimated: >60 mL/min (ref >60)
Glucose, Bld: 106 mg/dL — ABNORMAL HIGH (ref 70–99)
Potassium: 4 mmol/L (ref 3.5–5.1)
Sodium: 136 mmol/L (ref 135–145)
Total Bilirubin: 0.6 mg/dL (ref 0.3–1.2)
Total Protein: 7.4 g/dL (ref 6.5–8.1)

## 2021-05-17 LAB — RESP PANEL BY RT-PCR (FLU A&B, COVID) ARPGX2
Influenza A by PCR: NEGATIVE
Influenza B by PCR: NEGATIVE
SARS Coronavirus 2 by RT PCR: NEGATIVE

## 2021-05-17 LAB — LIPASE, BLOOD: Lipase: 14 U/L (ref 11–51)

## 2021-05-17 MED ORDER — SODIUM CHLORIDE 0.9 % IV BOLUS
1000.0000 mL | Freq: Once | INTRAVENOUS | Status: AC
  Administered 2021-05-17: 12:00:00 1000 mL via INTRAVENOUS

## 2021-05-17 MED ORDER — ONDANSETRON HCL 4 MG/2ML IJ SOLN
4.0000 mg | Freq: Once | INTRAMUSCULAR | Status: AC
  Administered 2021-05-17: 12:00:00 4 mg via INTRAVENOUS
  Filled 2021-05-17: qty 2

## 2021-05-17 MED ORDER — MORPHINE SULFATE (PF) 4 MG/ML IV SOLN
4.0000 mg | Freq: Once | INTRAVENOUS | Status: AC
  Administered 2021-05-17: 14:00:00 4 mg via INTRAVENOUS
  Filled 2021-05-17: qty 1

## 2021-05-17 MED ORDER — KETOROLAC TROMETHAMINE 30 MG/ML IJ SOLN
30.0000 mg | Freq: Once | INTRAMUSCULAR | Status: AC
  Administered 2021-05-17: 12:00:00 30 mg via INTRAVENOUS
  Filled 2021-05-17: qty 1

## 2021-05-17 NOTE — ED Triage Notes (Signed)
Pt arrives to ED with c/o abdominal pain. The pain started as right sided flank pain x3 days ago. Pt reports she had hematuria. The pain has worsened, intermittent, and sharp. Associated symptoms include constipation, emesis, nausea.

## 2021-05-17 NOTE — ED Notes (Signed)
RN provided AVS using Teachback Method. Patient verbalizes understanding of Discharge Instructions. Opportunity for Questioning and Answers were provided by RN. Patient Discharged from ED with Friend to Dr. Corinna Capra Office for Immediate Follow-Up.

## 2021-05-17 NOTE — ED Notes (Addendum)
Patient returned from US.

## 2021-05-17 NOTE — ED Notes (Signed)
Patient transported to [place] at this Time.

## 2021-05-17 NOTE — Discharge Instructions (Addendum)
Go straight to Dr. Gregor Hams office physician for women's in [place].  Check-in at the desk, they will proceed with ultrasound.  Do not eat or drink anything.

## 2021-05-17 NOTE — ED Notes (Signed)
Patient ambulatory to bathroom to provide urine sample.

## 2021-05-17 NOTE — ED Provider Notes (Signed)
[facility] EMERGENCY DEPT Provider Note   CSN: [id number] Arrival date & time: 05/17/21  1004     History Chief Complaint  Patient presents with   Abdominal Pain    Margaret Villanueva is a 40 y.o. female.  HPI  Patient status post hysterectomy and C-section presents due to right flank pain.  Started 3 days ago, the pain is intermittent.  Associated with multiple episodes of emesis and nausea.  Unable to keep food down for the last 2 days.  Denies any diarrhea, no fevers at home.  Unable to identify provoking factor, nothing is alleviated the pain.  Was seen in urgent care on Monday, they did a urine with had some trace hemoglobin.  Patient denies any dysuria or hematuria that she has noticed.  Was seen by her primary care doctor earlier today who advised to go to the ED for work-up.  Patient does not have history of kidney stones.  Past Medical History:  Diagnosis Date   Blood transfusion 1981   as newborn- [place]     Patient Active Problem List   Diagnosis Date Noted   Family history of abdominal aortic aneurysm (AAA) 03/30/2021    Past Surgical History:  Procedure Laterality Date   BREAST BIOPSY Right 05/09/2021   CESAREAN SECTION     LAPAROSCOPIC ASSISTED VAGINAL HYSTERECTOMY  10/31/2011   Procedure: LAPAROSCOPIC ASSISTED VAGINAL HYSTERECTOMY;  Surgeon: Luz Lex, MD;  Location: [facility];  Service: Gynecology;  Laterality: N/A;     OB History   No obstetric history on file.     Family History  Problem Relation Age of Onset   Heart disease Mother    Breast cancer Mother 43   Heart disease Father    Hypertension Sister    Breast cancer Maternal Aunt     Social History   Tobacco Use   Smoking status: Never   Smokeless tobacco: Never  Vaping Use   Vaping Use: Never used  Substance Use Topics   Alcohol use: No   Drug use: No    Home Medications Prior to Admission medications   Medication Sig Start Date End Date Taking? Authorizing Provider   amoxicillin-clavulanate (AUGMENTIN) 875-125 MG tablet Take 1 tablet by mouth 2 (two) times daily. 05/15/21  Yes [provider]  Ascorbic Acid (VITAMIN C) 1000 MG tablet Take 1,000 mg by mouth daily.   Yes [provider]  levothyroxine (SYNTHROID) 25 MCG tablet Take 2 tablets by mouth daily at 6 (six) AM.   Yes [provider]  magnesium oxide (MAG-OX) 400 MG tablet Take 400 mg by mouth daily.   Yes [provider]  ondansetron (ZOFRAN-ODT) 8 MG disintegrating tablet Take 8 mg by mouth 3 (three) times daily. 05/15/21  Yes [provider]  traMADol (ULTRAM) 50 MG tablet Take 50 mg by mouth every 6 (six) hours as needed. 05/15/21  Yes [provider]    Allergies    Patient has no known allergies.  Review of Systems   Review of Systems  Constitutional:  Negative for chills and fever.  HENT:  Negative for sore throat.   Eyes:  Negative for pain and visual disturbance.  Respiratory:  Negative for cough and shortness of breath.   Cardiovascular:  Negative for chest pain and palpitations.  Gastrointestinal:  Positive for abdominal pain, nausea and vomiting. Negative for diarrhea.  Genitourinary:  Negative for dysuria and hematuria.  Musculoskeletal:  Negative for arthralgias and back pain.  Skin:  Negative  for color change and rash.  Neurological:  Negative for seizures and syncope.  All other systems reviewed and are negative.  Physical Exam Updated Vital Signs BP 123/70 (BP Location: Right Arm)    Pulse 77    Temp 98 F (36.7 C) (Temporal)    Resp 16    Ht 5\' 4"  (1.626 m)    Wt 112 kg    LMP 10/22/2011    SpO2 98%    BMI 42.40 kg/m   Physical Exam Vitals and nursing note reviewed. Exam conducted with a chaperone present.  Constitutional:      Appearance: Normal appearance. She is obese.  HENT:     Head: Normocephalic and atraumatic.  Eyes:     General: No scleral icterus.       Right eye: No discharge.        Left eye: No  discharge.     Extraocular Movements: Extraocular movements intact.     Pupils: Pupils are equal, round, and reactive to light.  Cardiovascular:     Rate and Rhythm: Normal rate and regular rhythm.     Pulses: Normal pulses.     Heart sounds: Normal heart sounds. No murmur heard.   No friction rub. No gallop.  Pulmonary:     Effort: Pulmonary effort is normal. No respiratory distress.     Breath sounds: Normal breath sounds.  Abdominal:     General: Abdomen is flat. Bowel sounds are normal. There is no distension.     Palpations: Abdomen is soft.     Tenderness: There is abdominal tenderness. There is right CVA tenderness.     Comments: Right CVAT. Diffuse abdominal tenderness without rigidity or guarding  Skin:    General: Skin is warm and dry.     Coloration: Skin is not jaundiced.  Neurological:     Mental Status: She is alert. Mental status is at baseline.     Coordination: Coordination normal.   ED Results / Procedures / Treatments   Labs (all labs ordered are listed, but only abnormal results are displayed) Labs Reviewed  COMPREHENSIVE METABOLIC PANEL - Abnormal; Notable for the following components:      Result Value   Glucose, Bld 106 (*)    All other components within normal limits  CBC - Abnormal; Notable for the following components:   RBC 5.36 (*)    Hemoglobin 15.5 (*)    HCT 46.5 (*)    All other components within normal limits  URINALYSIS, ROUTINE W REFLEX MICROSCOPIC - Abnormal; Notable for the following components:   Ketones, ur TRACE (*)    All other components within normal limits  LIPASE, BLOOD  PREGNANCY, URINE    EKG None  Radiology CT Renal Stone Study  Addendum Date: 05/17/2021   ADDENDUM REPORT: 05/17/2021 13:13 ADDENDUM: Omitted from impression is presence of a 1.8 cm diameter lytic lesion at the posterior aspect of the proximal RIGHT femur, indeterminate attenuation, of uncertain etiology. Myeloma and lytic metastasis not excluded with this  appearance, though benign processes could cause a similar lesion. Unless patient has prior outside imaging which can establish stability of this finding, recommend follow-up non emergent MR characterization. Electronically Signed   By: Lavonia Dana M.D.   On: 05/17/2021 13:13   Result Date: 05/17/2021 CLINICAL DATA:  Abdominal pain, began and RIGHT flank pain 3 days ago with associated hematuria, nausea, and vomiting, worsened intermittent sharp pain since EXAM: CT ABDOMEN AND PELVIS WITHOUT CONTRAST TECHNIQUE: Multidetector CT imaging of  the abdomen and pelvis was performed following the standard protocol without IV contrast. COMPARISON:  None FINDINGS: Lower chest: Lung bases clear Hepatobiliary: Gallbladder and liver normal appearance Pancreas: Normal appearance Spleen: Normal appearance Adrenals/Urinary Tract: Adrenal glands, kidneys, ureters, and bladder normal appearance. No urinary tract calcification or dilatation. Stomach/Bowel: Normal appendix. Stomach and bowel loops normal appearance Vascular/Lymphatic: Aorta normal caliber.  No adenopathy. Reproductive: Uterus surgically absent by history. High lateral position of LEFT ovary, unremarkable. Soft tissue mass within RIGHT pelvis, 6.8 x 4.1 x 7.0 cm, question enlarged abnormal RIGHT ovary such as from ovarian torsion versus ovarian mass. Other: Small to moderate free fluid in pelvis. No free air. Umbilical hernia containing fat. Musculoskeletal: Lytic lesion at proximal RIGHT femur posteriorly 1.8 cm greatest diameter. Associated Endosteal thinning. No additional bone lesions. IMPRESSION: Normal appendix. No evidence of urinary tract calcification or dilatation. Post hysterectomy with normal appearing LEFT ovary. Small to moderate amount of free fluid in pelvis with a 6.8 x 4.1 x 7.0 cm diameter RIGHT pelvic mass, question ovarian tumor versus torsion; ultrasound assessment recommended. Findings discussed with Dr. Pearline Cables on 05/17/2021 at 1248 hours.  Electronically Signed: By: Lavonia Dana M.D. On: 05/17/2021 12:49    Procedures Procedures   Medications Ordered in ED Medications  sodium chloride 0.9 % bolus 1,000 mL (1,000 mLs Intravenous New Bag/Given 05/17/21 1207)  ondansetron (ZOFRAN) injection 4 mg (4 mg Intravenous Given 05/17/21 1203)  ketorolac (TORADOL) 30 MG/ML injection 30 mg (30 mg Intravenous Given 05/17/21 1204)    ED Course  I have reviewed the triage vital signs and the nursing notes.  Pertinent labs & imaging results that were available during my care of the patient were reviewed by me and considered in my medical decision making (see chart for details).    MDM Rules/Calculators/A&P                           Stable vitals, patient is nontoxic-appearing but uncomfortable.  We will get CT renal given persistent right flank pain with hematuria on Monday.  Pain is primarily right flank, she does have some diffuse tenderness to the abdomen but is not particularly localized to upper or lower.  She does report constipation, SBO is a possibility but I think it somewhat less likely given she is still passing gas.  Labs/IMG: CBC without leukocytosis.  Hemoglobin and RBC is elevated, most recent labs to compare were from 9 years ago so unclear if this is baseline.  Do not think it is contributory to her symptoms currently. CMP: No gross electrolyte derangement, AKI, LFT abnormalities. Lipase: Lipase negative, doubt pancreatitis. UA: Patient is not pregnant.  Ketonuria but no hemoglobin or RBCs in the urine. CT renal: We will proceed with ultrasound to evaluate for ovarian torsion. Small to moderate amount of free fluid in pelvis with a 6.8 x 4.1 x  7.0 cm diameter RIGHT pelvic mass, question ovarian tumor versus  torsion; ultrasound assessment recommended.   Ultrasound notable for right ovarian torsion incomplete.  Consulted with Dr. Corinna Capra, I read him the CT result and the ultrasound result.  We discussed the patient's  presentation and pain, he reports he saw in the office 1 month ago.  He states that it is uncommon for ovarian torsion's to be intermittent and that because she has blood flow he is not convinced she needs emergency surgery.  He advised admitting to hospitalist, hospitalist do not think this is an appropriate admission  given it is a acute gynecologic process and likely emergency.  The MAU is unwilling to admit the patient given she is not pregnant.  I spoke with Dr. Corinna Capra again, given there is greater than a 12-hour wait time at [facility] he does not think ED to ED transfer is reasonable.  He personally reviewed the ultrasound, he is unclear if is going to require surgical fix and wants to repeat the ultrasound in his office.  I spoke with the patient, she has stable vitals and her pain is well controlled.  I believe she is stable enough to go by personal vehicle to Dr. Gregor Hams office. He will admit if needed. Advised to stay NPO. Her husband will drive her. Patient discharged in stable position.   Final Clinical Impression(s) / ED Diagnoses Final diagnoses:  Pelvic pain    Rx / DC Orders ED Discharge Orders     None        Sherrill Raring, Hershal Coria 05/17/21 2133    Wynona Dove A, DO 05/19/21 1634

## 2021-05-17 NOTE — ED Notes (Signed)
Patient returned from Radiology. 

## 2021-06-27 ENCOUNTER — Other Ambulatory Visit: Payer: Self-pay | Admitting: Otolaryngology

## 2022-04-16 ENCOUNTER — Other Ambulatory Visit: Payer: Self-pay | Admitting: Obstetrics and Gynecology

## 2022-04-16 DIAGNOSIS — R928 Other abnormal and inconclusive findings on diagnostic imaging of breast: Secondary | ICD-10-CM

## 2022-05-07 ENCOUNTER — Ambulatory Visit
Admission: RE | Admit: 2022-05-07 | Discharge: 2022-05-07 | Disposition: A | Discharge status: Home / Self Care | Payer: 59 | Source: Ambulatory Visit | Attending: Obstetrics and Gynecology | Admitting: Obstetrics and Gynecology

## 2022-05-07 ENCOUNTER — Other Ambulatory Visit: Payer: Self-pay | Admitting: Obstetrics and Gynecology

## 2022-05-07 DIAGNOSIS — R928 Other abnormal and inconclusive findings on diagnostic imaging of breast: Secondary | ICD-10-CM

## 2022-05-09 ENCOUNTER — Ambulatory Visit
Admission: RE | Admit: 2022-05-09 | Discharge: 2022-05-09 | Disposition: A | Discharge status: Home / Self Care | Payer: 59 | Source: Ambulatory Visit | Attending: Obstetrics and Gynecology | Admitting: Obstetrics and Gynecology

## 2022-05-09 DIAGNOSIS — R928 Other abnormal and inconclusive findings on diagnostic imaging of breast: Secondary | ICD-10-CM

## 2022-05-09 HISTORY — PX: BREAST BIOPSY: SHX20

## 2023-05-09 IMAGING — MG MM DIGITAL DIAGNOSTIC UNILAT*R* W/ TOMO W/ CAD
4 series · 4 of 12 positions shown · non-contrast
Comparison: Previous exams including recent screening mammogram
dated 04/06/2021.

CLINICAL DATA: Patient returns today to evaluate a possible RIGHT
breast asymmetry questioned on recent screening mammogram.

EXAM:
DIGITAL DIAGNOSTIC UNILATERAL RIGHT MAMMOGRAM WITH TOMOSYNTHESIS AND
CAD; ULTRASOUND RIGHT BREAST LIMITED
TECHNIQUE: Right digital diagnostic mammography and breast tomosynthesis was
performed. The images were evaluated with computer-aided detection.;
Targeted ultrasound examination of the right breast was performed

[R CC synth-2D]
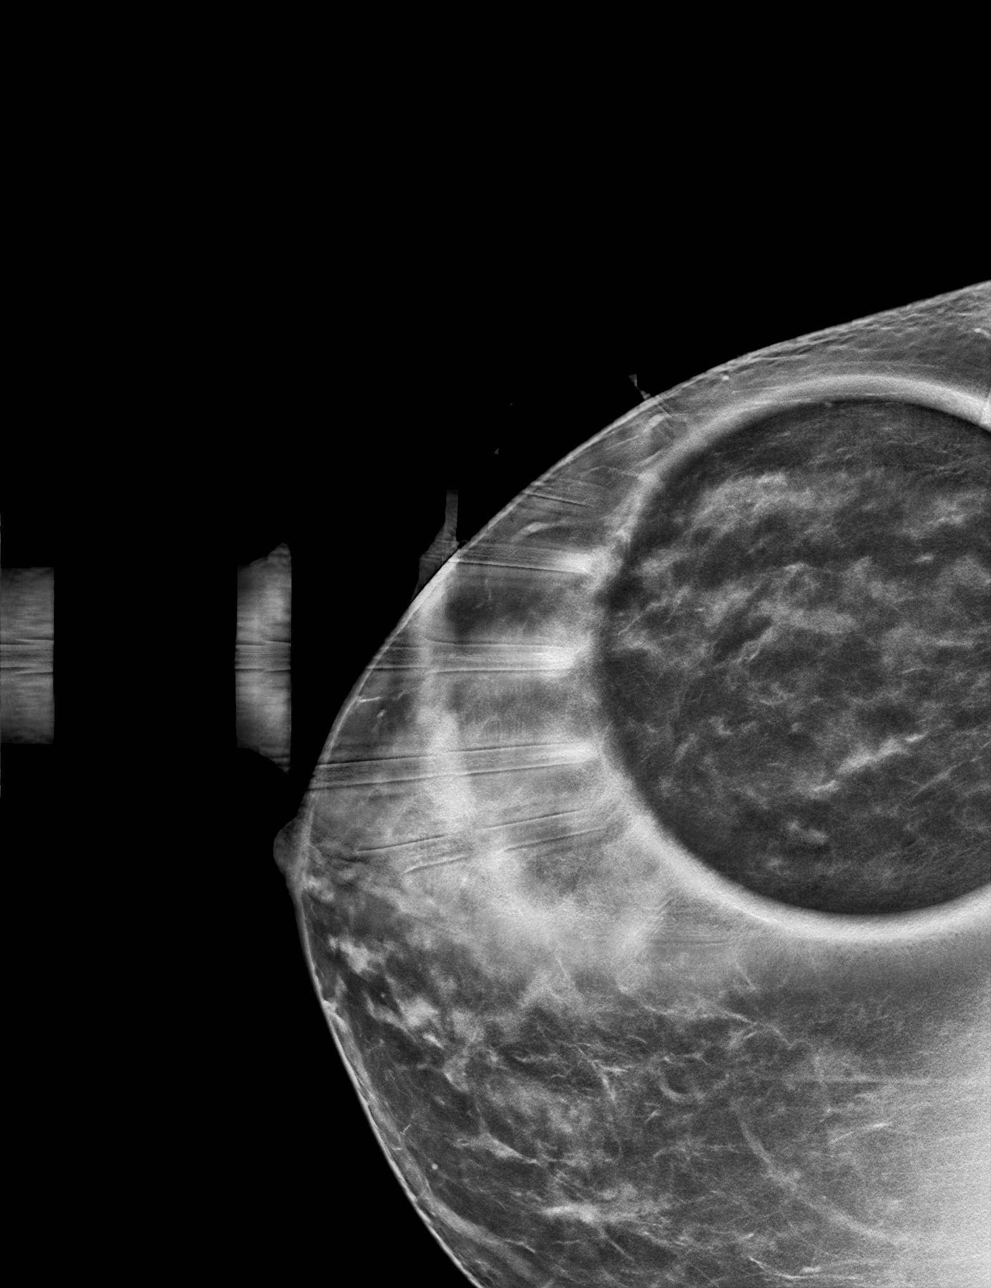

[R MLO synth-2D]
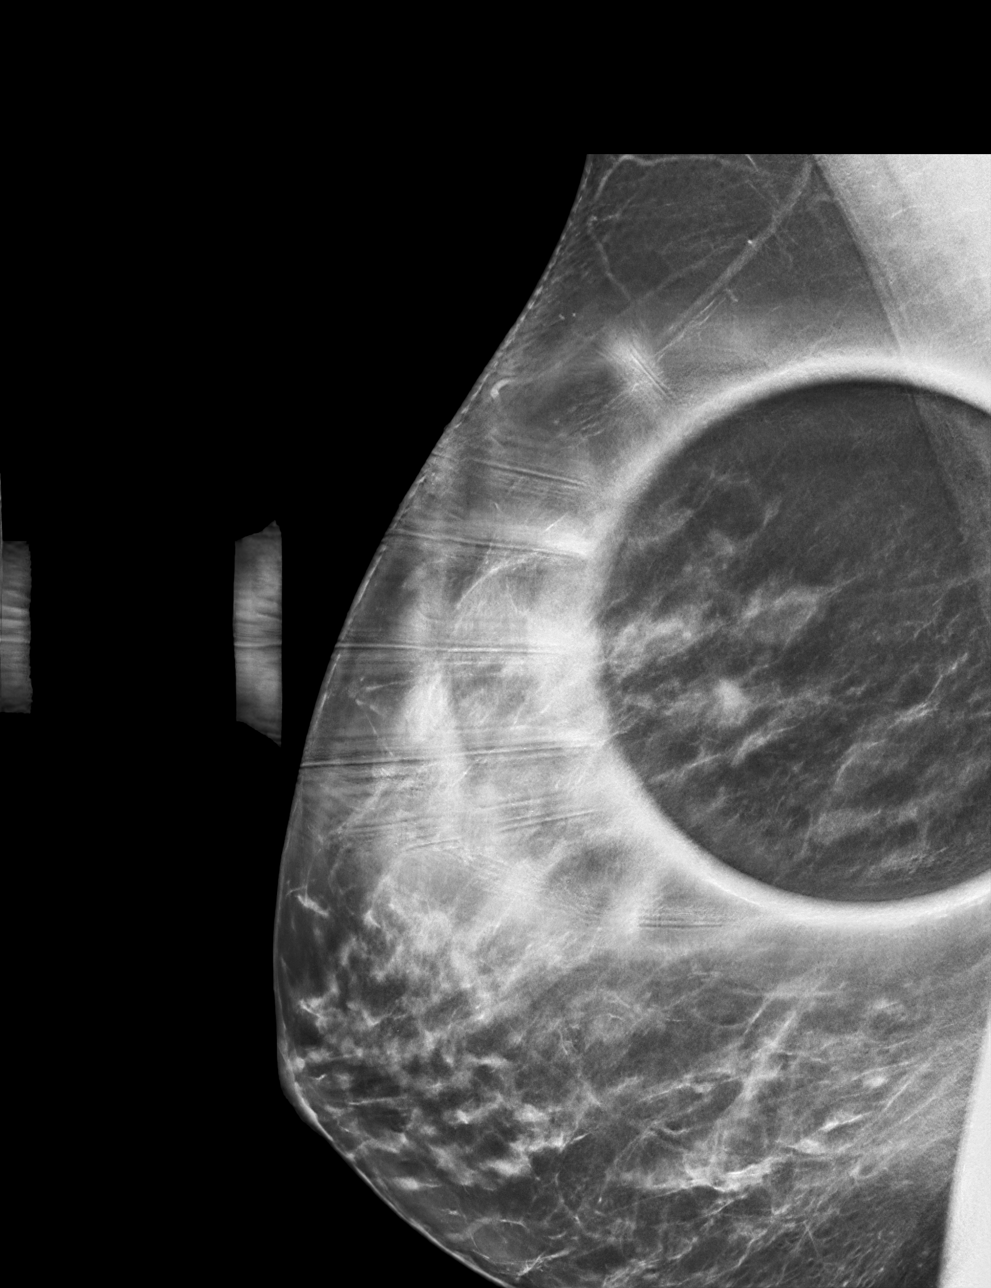

[R MLO tomo · tomo slice 40/79.0]
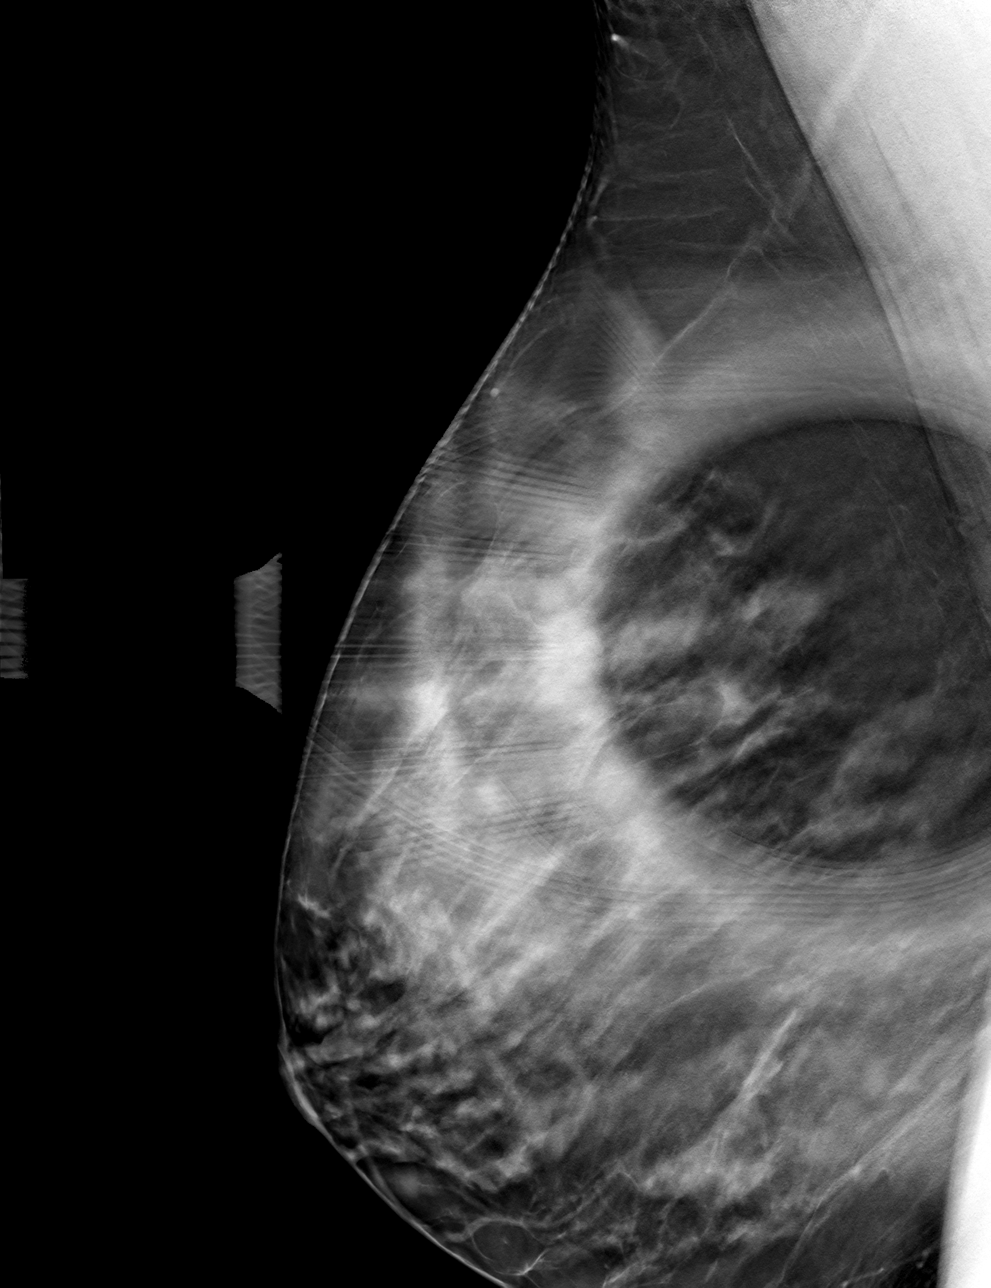

[R CC tomo · tomo slice 37/74.0]
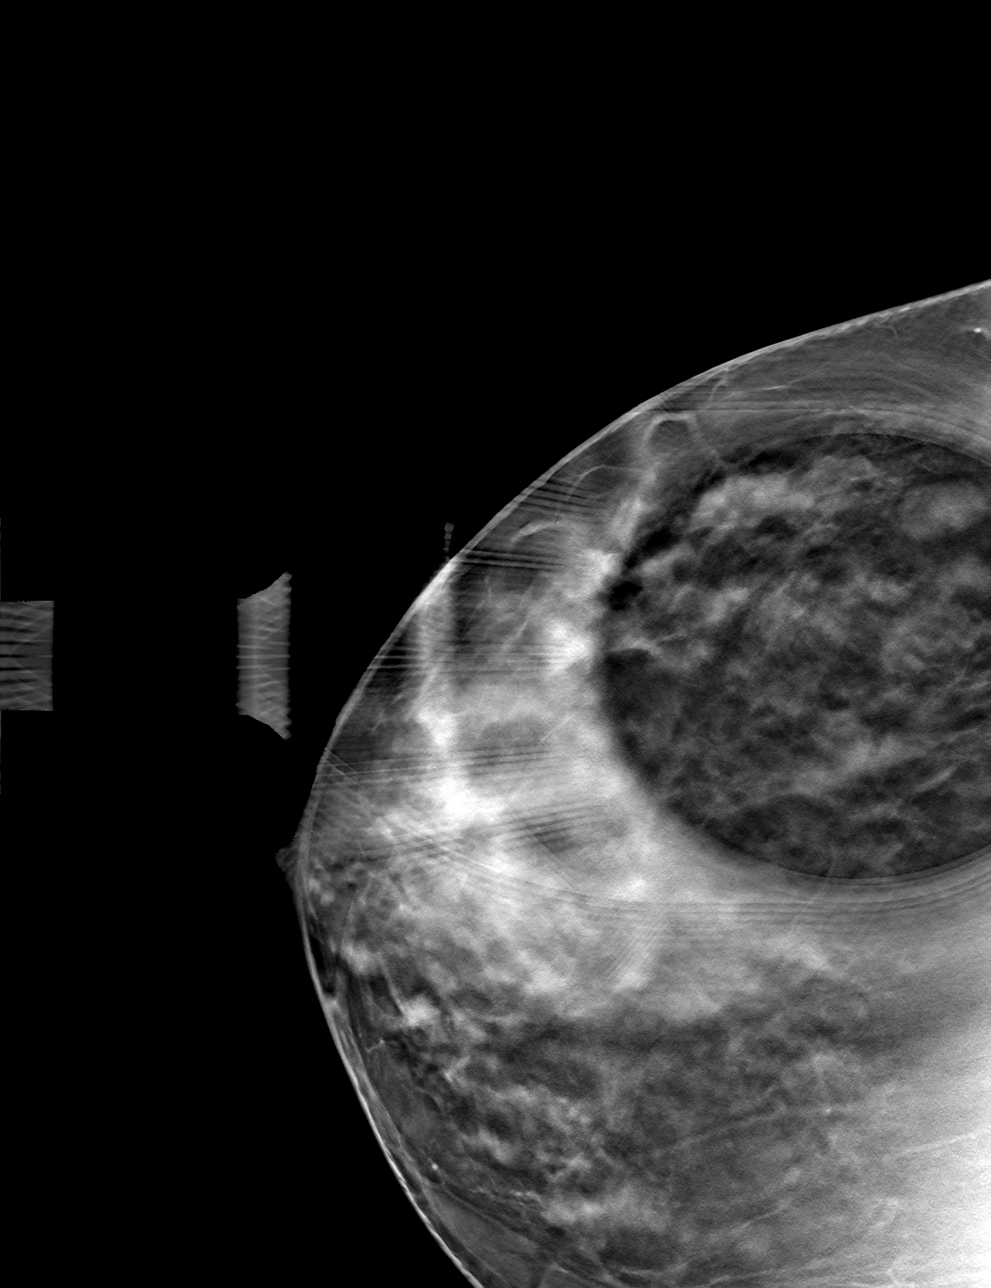

[4 of 12 positions shown; findings below may reference images not displayed]

ACR Breast Density Category c: The breast tissue is heterogeneously
dense, which may obscure small masses.
FINDINGS: On today's additional diagnostic views, including spot compression
with 3D tomosynthesis, an oval circumscribed low-density mass is
confirmed within the upper-outer quadrant of the RIGHT breast, at
posterior depth, measuring approximally 1.4 cm greatest dimension.

Targeted ultrasound is performed, showing an oval circumscribed
hypoechoic mass in the RIGHT breast at the 10 o'clock axis, 10 cm
from the nipple, measuring 1.3 x 0.4 x 0.7 cm, corresponding to the
mammographic finding.

Additional benign cysts are identified within the RIGHT breast, 9-10
o'clock axes, corresponding as incidental findings.

RIGHT axilla was evaluated with ultrasound showing no enlarged or
morphologically abnormal lymph nodes.
IMPRESSION: Oval circumscribed hypoechoic mass in the RIGHT breast at the 10
o'clock axis, 10 cm from the nipple, measuring 1.3 cm, corresponding
to the mammographic finding. This may represent a benign
fibroadenoma. Ultrasound-guided biopsy is recommended to exclude
malignancy.

RECOMMENDATION:
Ultrasound-guided biopsy for the RIGHT breast mass at the 10 o'clock
axis, 10 cm from the nipple, measuring 1.3 cm.

Ultrasound-guided biopsy is scheduled for [DATE].

I have discussed the findings and recommendations with the patient.
If applicable, a reminder letter will be sent to the patient
regarding the next appointment.

BI-RADS CATEGORY  4: Suspicious.

## 2023-05-23 IMAGING — CT CT RENAL STONE PROTOCOL
2 of 4 series · 15 of 46 positions shown, 17 images · non-contrast
Comparison: None
COMPARISON: None

Addendum:
CLINICAL DATA: Abdominal pain, began and RIGHT flank pain 3 days
ago with associated hematuria, nausea, and vomiting, worsened
intermittent sharp pain since

EXAM:
CT ABDOMEN AND PELVIS WITHOUT CONTRAST
TECHNIQUE: Multidetector CT imaging of the abdomen and pelvis was performed
following the standard protocol without IV contrast.

[Series 2: stone full · axial · 0.89mm/px · z∈[+711,+1136]mm · 12 of 97 slices shown, 14 images]
[im 8/97  soft-tissue]
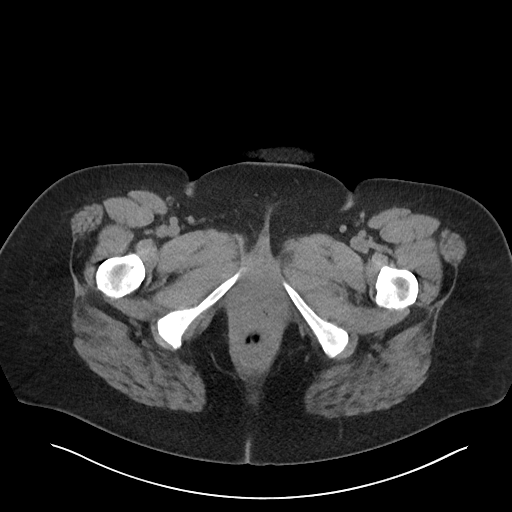
[im 8/97  bone]
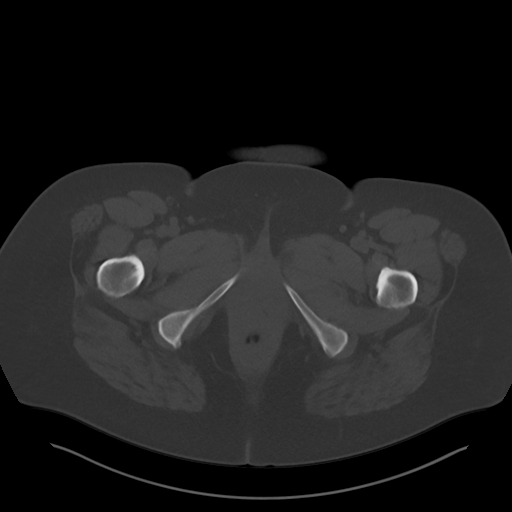
[im 16/97  soft-tissue]
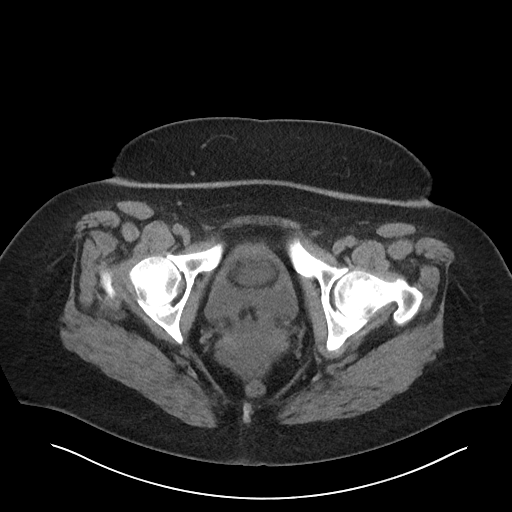
[im 24/97  soft-tissue]
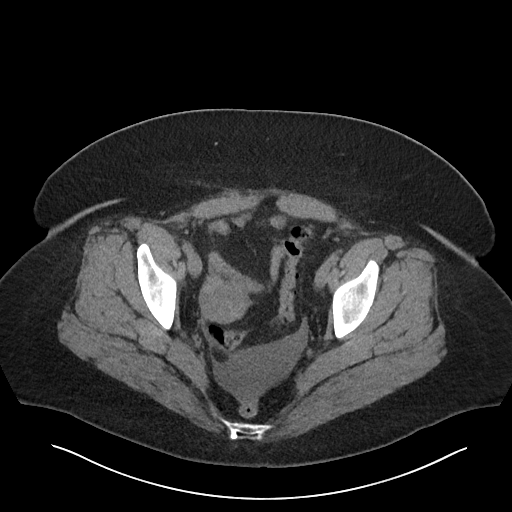
[im 31/97  soft-tissue]
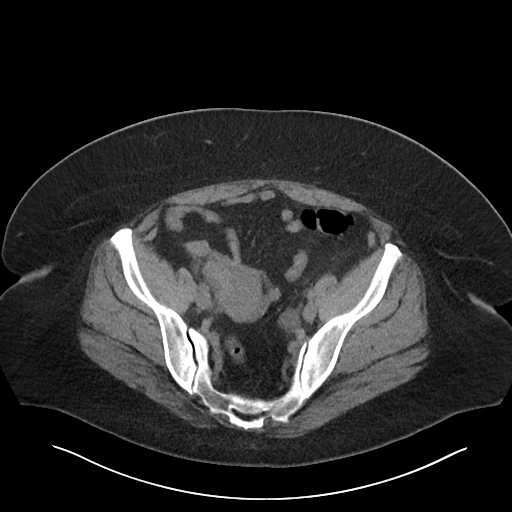
[im 39/97  soft-tissue]
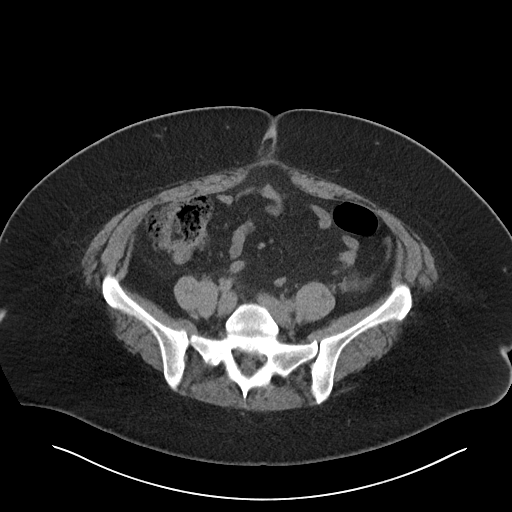
[im 47/97  soft-tissue]
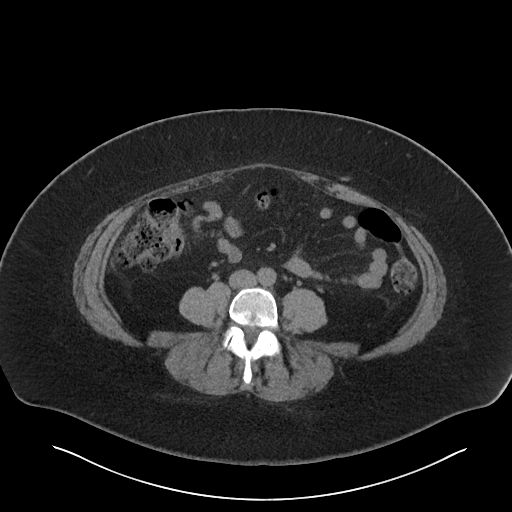
[im 54/97  soft-tissue]
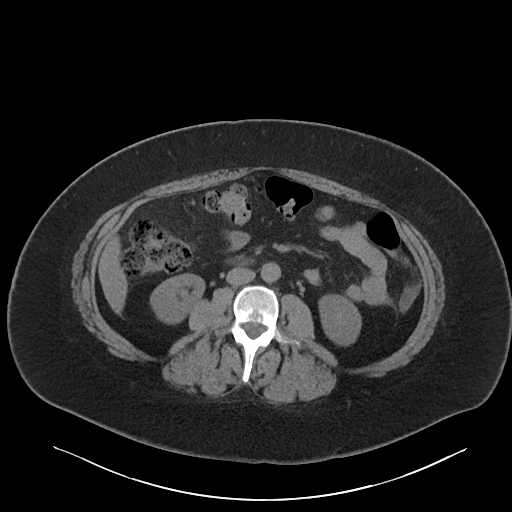
[im 62/97  soft-tissue]
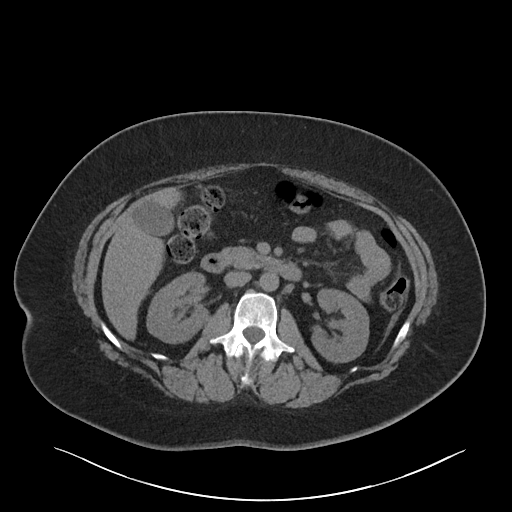
[im 70/97  soft-tissue]
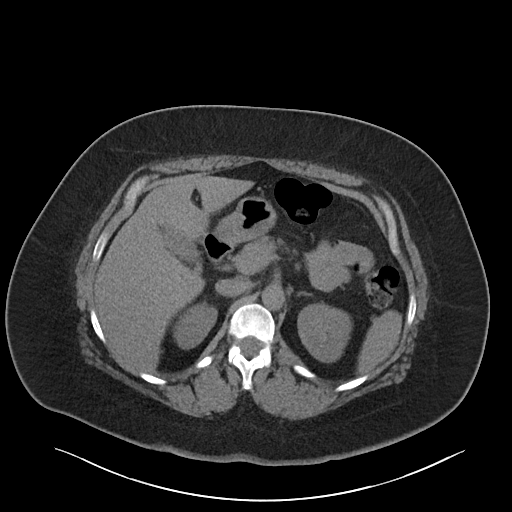
[im 70/97  bone]
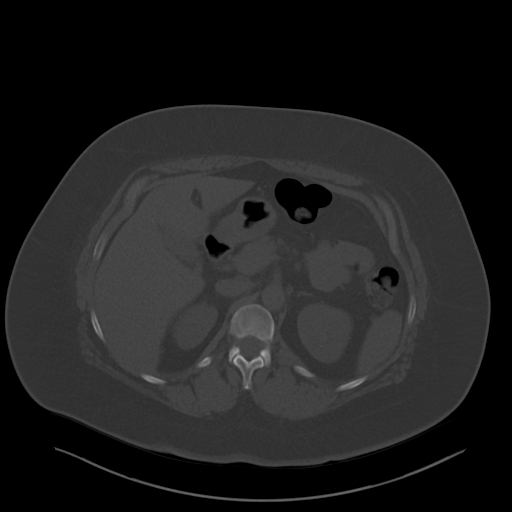
[im 77/97  soft-tissue]
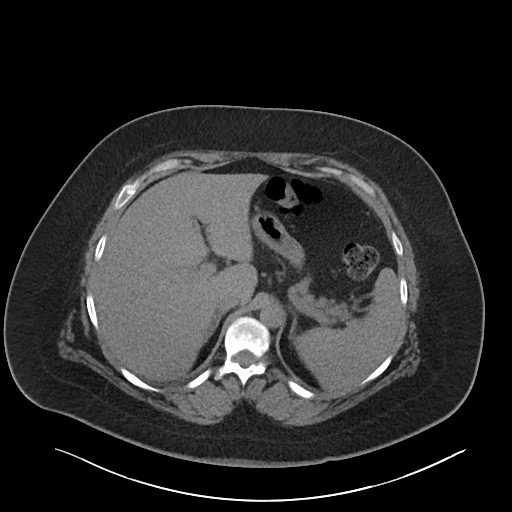
[im 85/97  soft-tissue]
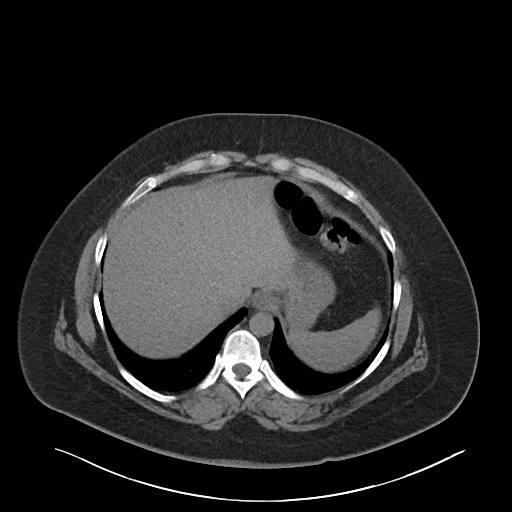
[im 93/97  soft-tissue]
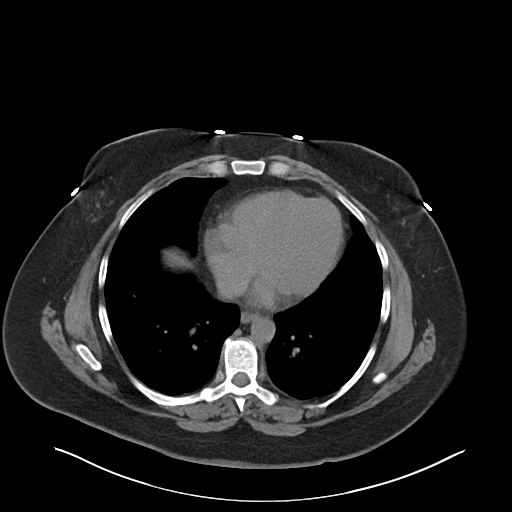

[Series 5: coronal · coronal · 0.95mm/px · 3 of 114 slices shown]
[im 38/114  soft-tissue]
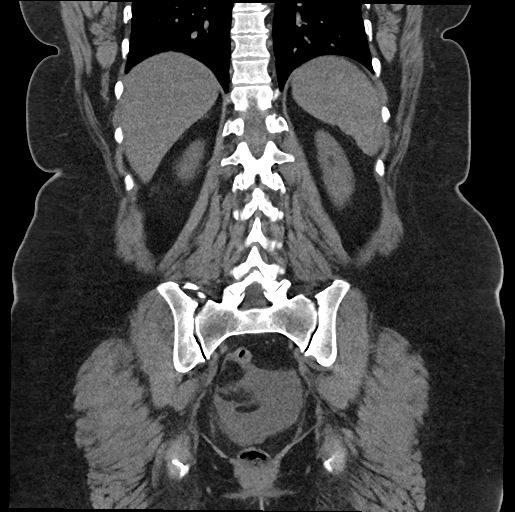
[im 51/114  soft-tissue]
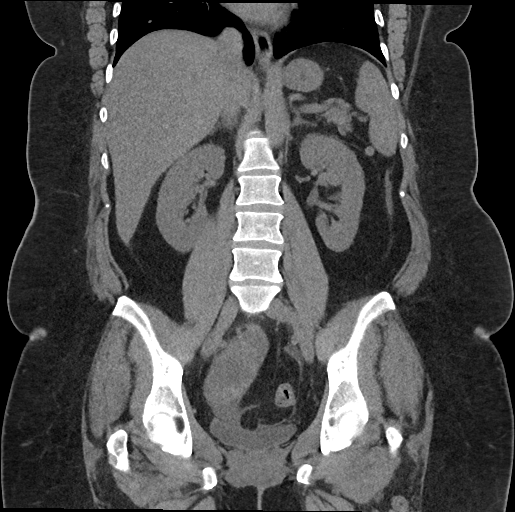
[im 63/114  soft-tissue]
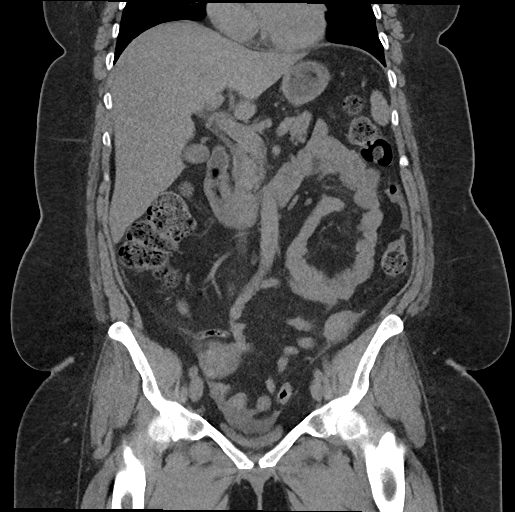

[15 of 46 positions shown; findings below may reference images not displayed]

FINDINGS: Lower chest: Lung bases clear

Hepatobiliary: Gallbladder and liver normal appearance

Pancreas: Normal appearance

Spleen: Normal appearance

Adrenals/Urinary Tract: Adrenal glands, kidneys, ureters, and
bladder normal appearance. No urinary tract calcification or
dilatation.

Stomach/Bowel: Normal appendix. Stomach and bowel loops normal
appearance

Vascular/Lymphatic: Aorta normal caliber.  No adenopathy.

Reproductive: Uterus surgically absent by history. High lateral
position of LEFT ovary, unremarkable. Soft tissue mass within RIGHT
pelvis, [DATE] x 4.1 x 7.0 cm, question enlarged abnormal RIGHT ovary
such as from ovarian torsion versus ovarian mass.

Other: Small to moderate free fluid in pelvis. No free air.
Umbilical hernia containing fat.

Musculoskeletal: Lytic lesion at proximal RIGHT femur posteriorly
1.8 cm greatest diameter. Associated Endosteal thinning. No
additional bone lesions.
IMPRESSION: Normal appendix.

No evidence of urinary tract calcification or dilatation.

Post hysterectomy with normal appearing LEFT ovary.

Small to moderate amount of free fluid in pelvis with a 6.8 x 4.1 x
7.0 cm diameter RIGHT pelvic mass, question ovarian tumor versus
torsion; ultrasound assessment recommended.

Findings discussed with Dr. Nicita on 05/17/2021 at 0207 hours.

ADDENDUM:
Omitted from impression is presence of a 1.8 cm diameter lytic
lesion at the posterior aspect of the proximal RIGHT femur,
indeterminate attenuation, of uncertain etiology. Myeloma and lytic
metastasis not excluded with this appearance, though benign
processes could cause a similar lesion. Unless patient has prior
outside imaging which can establish stability of this finding,
recommend follow-up non emergent MR characterization.

*** End of Addendum ***
FINDINGS: Lower chest: Lung bases clear

Hepatobiliary: Gallbladder and liver normal appearance

Pancreas: Normal appearance

Spleen: Normal appearance

Adrenals/Urinary Tract: Adrenal glands, kidneys, ureters, and
bladder normal appearance. No urinary tract calcification or
dilatation.

Stomach/Bowel: Normal appendix. Stomach and bowel loops normal
appearance

Vascular/Lymphatic: Aorta normal caliber.  No adenopathy.

Reproductive: Uterus surgically absent by history. High lateral
position of LEFT ovary, unremarkable. Soft tissue mass within RIGHT
pelvis, [DATE] x 4.1 x 7.0 cm, question enlarged abnormal RIGHT ovary
such as from ovarian torsion versus ovarian mass.

Other: Small to moderate free fluid in pelvis. No free air.
Umbilical hernia containing fat.

Musculoskeletal: Lytic lesion at proximal RIGHT femur posteriorly
1.8 cm greatest diameter. Associated Endosteal thinning. No
additional bone lesions.
IMPRESSION: Normal appendix.

No evidence of urinary tract calcification or dilatation.

Post hysterectomy with normal appearing LEFT ovary.

Small to moderate amount of free fluid in pelvis with a 6.8 x 4.1 x
7.0 cm diameter RIGHT pelvic mass, question ovarian tumor versus
torsion; ultrasound assessment recommended.

Findings discussed with Dr. Nicita on 05/17/2021 at 0207 hours.
